# Patient Record
Sex: Male | Born: 1995 | Hispanic: Yes | Marital: Single | Smoking: Current some day smoker
Health system: Southern US, Community
[De-identification: ages and names within clinical notes are randomized; demographics above are authoritative.]

---

## 2013-12-25 ENCOUNTER — Emergency Department (HOSPITAL_COMMUNITY): Payer: No Typology Code available for payment source

## 2013-12-25 ENCOUNTER — Encounter (HOSPITAL_COMMUNITY): Payer: Self-pay | Admitting: Emergency Medicine

## 2013-12-25 ENCOUNTER — Emergency Department (HOSPITAL_COMMUNITY)
Admission: EM | Admit: 2013-12-25 | Discharge: 2013-12-25 | Disposition: A | Discharge status: Home / Self Care | Payer: No Typology Code available for payment source | Attending: Emergency Medicine | Admitting: Emergency Medicine

## 2013-12-25 DIAGNOSIS — Y9389 Activity, other specified: Secondary | ICD-10-CM | POA: Insufficient documentation

## 2013-12-25 DIAGNOSIS — S4980XA Other specified injuries of shoulder and upper arm, unspecified arm, initial encounter: Secondary | ICD-10-CM | POA: Insufficient documentation

## 2013-12-25 DIAGNOSIS — S79929A Unspecified injury of unspecified thigh, initial encounter: Secondary | ICD-10-CM

## 2013-12-25 DIAGNOSIS — Y92410 Unspecified street and highway as the place of occurrence of the external cause: Secondary | ICD-10-CM | POA: Insufficient documentation

## 2013-12-25 DIAGNOSIS — S46909A Unspecified injury of unspecified muscle, fascia and tendon at shoulder and upper arm level, unspecified arm, initial encounter: Secondary | ICD-10-CM | POA: Insufficient documentation

## 2013-12-25 DIAGNOSIS — S199XXA Unspecified injury of neck, initial encounter: Secondary | ICD-10-CM

## 2013-12-25 DIAGNOSIS — S0993XA Unspecified injury of face, initial encounter: Secondary | ICD-10-CM | POA: Insufficient documentation

## 2013-12-25 DIAGNOSIS — F17200 Nicotine dependence, unspecified, uncomplicated: Secondary | ICD-10-CM | POA: Insufficient documentation

## 2013-12-25 DIAGNOSIS — M25512 Pain in left shoulder: Secondary | ICD-10-CM

## 2013-12-25 DIAGNOSIS — M25551 Pain in right hip: Secondary | ICD-10-CM

## 2013-12-25 DIAGNOSIS — IMO0002 Reserved for concepts with insufficient information to code with codable children: Secondary | ICD-10-CM | POA: Insufficient documentation

## 2013-12-25 DIAGNOSIS — S79919A Unspecified injury of unspecified hip, initial encounter: Secondary | ICD-10-CM | POA: Insufficient documentation

## 2013-12-25 MED ORDER — IBUPROFEN 600 MG PO TABS: 600.0000 mg | ORAL_TABLET | Freq: Four times a day (QID) | ORAL | Status: AC | PRN

## 2013-12-25 MED ORDER — KETOROLAC TROMETHAMINE 60 MG/2ML IM SOLN
60.0000 mg | Freq: Once | INTRAMUSCULAR | Status: AC
  Administered 2013-12-25: 60 mg via INTRAMUSCULAR
  Filled 2013-12-25: qty 2

## 2013-12-25 MED ORDER — HYDROCODONE-ACETAMINOPHEN 5-325 MG PO TABS
2.0000 | ORAL_TABLET | Freq: Once | ORAL | Status: AC
  Administered 2013-12-25: 2 via ORAL
  Filled 2013-12-25: qty 2

## 2013-12-25 MED ORDER — CYCLOBENZAPRINE HCL 10 MG PO TABS
10.0000 mg | ORAL_TABLET | Freq: Once | ORAL | Status: AC
  Administered 2013-12-25: 10 mg via ORAL
  Filled 2013-12-25: qty 1

## 2013-12-25 MED ORDER — CYCLOBENZAPRINE HCL 10 MG PO TABS: 10.0000 mg | ORAL_TABLET | Freq: Two times a day (BID) | ORAL | Status: AC | PRN

## 2013-12-25 MED ORDER — OXYCODONE-ACETAMINOPHEN 5-325 MG PO TABS: ORAL_TABLET | ORAL | Status: AC

## 2013-12-25 NOTE — ED Notes (Addendum)
EDPA Erin at bedside. 

## 2013-12-25 NOTE — ED Notes (Signed)
Pt discharged home with all belongings, pt alert, oriented, and ambulatory upon discharge, 3 new RX prescribed, pt verbalizes understanding of discharge instructions, pt driven home by parents
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
## 2013-12-25 NOTE — ED Notes (Signed)
Family at bedside and updated on pt's plan of care, per pt's approval
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
## 2013-12-26 NOTE — ED Provider Notes (Signed)
Medical screening examination/treatment/procedure(s) were performed by non-physician practitioner and as supervising physician I was immediately available for consultation/collaboration.   EKG Interpretation None        Gavin PoundMichael Y. Oletta LamasGhim, MD 12/26/13 [phone]
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
